# Patient Record
Sex: Male | Born: 2000 | Race: White | Hispanic: No | Marital: Single | Smoking: Never smoker
Health system: Southern US, Community
[De-identification: ages and names within clinical notes are randomized; demographics above are authoritative.]

## PROBLEM LIST (undated history)

## (undated) DIAGNOSIS — R109 Unspecified abdominal pain: Secondary | ICD-10-CM

## (undated) DIAGNOSIS — R195 Other fecal abnormalities: Secondary | ICD-10-CM

## (undated) HISTORY — PX: TYMPANOSTOMY TUBE PLACEMENT: SHX32

## (undated) HISTORY — DX: Other fecal abnormalities: R19.5

---
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
## 2022-03-04 ENCOUNTER — Other Ambulatory Visit: Payer: Self-pay

## 2022-03-04 ENCOUNTER — Emergency Department (HOSPITAL_BASED_OUTPATIENT_CLINIC_OR_DEPARTMENT_OTHER): Payer: BC Managed Care – PPO | Admitting: Radiology

## 2022-03-04 ENCOUNTER — Emergency Department (HOSPITAL_BASED_OUTPATIENT_CLINIC_OR_DEPARTMENT_OTHER)
Admission: EM | Admit: 2022-03-04 | Discharge: 2022-03-04 | Disposition: A | Discharge status: Home / Self Care | Payer: BC Managed Care – PPO | Attending: Emergency Medicine | Admitting: Emergency Medicine

## 2022-03-04 ENCOUNTER — Encounter (HOSPITAL_BASED_OUTPATIENT_CLINIC_OR_DEPARTMENT_OTHER): Payer: Self-pay | Admitting: *Deleted

## 2022-03-04 DIAGNOSIS — W230XXA Caught, crushed, jammed, or pinched between moving objects, initial encounter: Secondary | ICD-10-CM | POA: Diagnosis not present

## 2022-03-04 DIAGNOSIS — S82831A Other fracture of upper and lower end of right fibula, initial encounter for closed fracture: Secondary | ICD-10-CM | POA: Insufficient documentation

## 2022-03-04 DIAGNOSIS — M25571 Pain in right ankle and joints of right foot: Secondary | ICD-10-CM | POA: Diagnosis present

## 2022-03-04 DIAGNOSIS — S82891A Other fracture of right lower leg, initial encounter for closed fracture: Secondary | ICD-10-CM
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
## 2022-03-04 NOTE — ED Notes (Signed)
Applied 2 ice packs
